# Patient Record
Sex: Female | Born: 1955 | Race: Black or African American | Hispanic: No | Smoking: Never smoker
Health system: Southern US, Community
[De-identification: ages and names within clinical notes are randomized; demographics above are authoritative.]

## PROBLEM LIST (undated history)

## (undated) DIAGNOSIS — D649 Anemia, unspecified: Secondary | ICD-10-CM

## (undated) DIAGNOSIS — J302 Other seasonal allergic rhinitis: Secondary | ICD-10-CM

## (undated) DIAGNOSIS — M1990 Unspecified osteoarthritis, unspecified site: Secondary | ICD-10-CM

## (undated) HISTORY — DX: Unspecified osteoarthritis, unspecified site: M19.90

## (undated) HISTORY — DX: Other seasonal allergic rhinitis: J30.2

## (undated) HISTORY — DX: Anemia, unspecified: D64.9

---
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
## 1988-02-13 HISTORY — PX: PARTIAL HYSTERECTOMY: SHX80

## 1999-04-11 ENCOUNTER — Other Ambulatory Visit: Admission: RE | Admit: 1999-04-11 | Discharge: 1999-04-11 | Payer: Self-pay | Admitting: Obstetrics and Gynecology

## 2000-07-18 ENCOUNTER — Other Ambulatory Visit: Admission: RE | Admit: 2000-07-18 | Discharge: 2000-07-18 | Payer: Self-pay | Admitting: Obstetrics and Gynecology

## 2002-07-02 ENCOUNTER — Other Ambulatory Visit: Admission: RE | Admit: 2002-07-02 | Discharge: 2002-07-02 | Payer: Self-pay | Admitting: Obstetrics and Gynecology

## 2017-03-20 ENCOUNTER — Other Ambulatory Visit: Payer: Self-pay

## 2017-03-20 ENCOUNTER — Encounter (HOSPITAL_COMMUNITY): Payer: Self-pay | Admitting: Emergency Medicine

## 2017-03-20 ENCOUNTER — Ambulatory Visit (INDEPENDENT_AMBULATORY_CARE_PROVIDER_SITE_OTHER): Payer: Commercial Managed Care - PPO

## 2017-03-20 ENCOUNTER — Ambulatory Visit (HOSPITAL_COMMUNITY)
Admission: EM | Admit: 2017-03-20 | Discharge: 2017-03-20 | Disposition: A | Discharge status: Home / Self Care | Payer: Commercial Managed Care - PPO | Attending: Family Medicine | Admitting: Family Medicine

## 2017-03-20 DIAGNOSIS — R0981 Nasal congestion: Secondary | ICD-10-CM | POA: Diagnosis not present

## 2017-03-20 DIAGNOSIS — M791 Myalgia, unspecified site: Secondary | ICD-10-CM

## 2017-03-20 DIAGNOSIS — R05 Cough: Secondary | ICD-10-CM

## 2017-03-20 DIAGNOSIS — J3489 Other specified disorders of nose and nasal sinuses: Secondary | ICD-10-CM

## 2017-03-20 DIAGNOSIS — R509 Fever, unspecified: Secondary | ICD-10-CM | POA: Diagnosis not present

## 2017-03-20 DIAGNOSIS — R69 Illness, unspecified: Principal | ICD-10-CM

## 2017-03-20 DIAGNOSIS — R0602 Shortness of breath: Secondary | ICD-10-CM

## 2017-03-20 DIAGNOSIS — R0609 Other forms of dyspnea: Secondary | ICD-10-CM

## 2017-03-20 DIAGNOSIS — J111 Influenza due to unidentified influenza virus with other respiratory manifestations: Secondary | ICD-10-CM

## 2017-03-20 MED ORDER — FLUTICASONE PROPIONATE 50 MCG/ACT NA SUSP: 2.0000 | Freq: Every day | NASAL | 0 refills | Status: DC

## 2017-03-20 MED ORDER — IPRATROPIUM BROMIDE 0.06 % NA SOLN: 2.0000 | Freq: Four times a day (QID) | NASAL | 0 refills | Status: DC

## 2017-03-20 MED ORDER — BENZONATATE 100 MG PO CAPS: 100.0000 mg | ORAL_CAPSULE | Freq: Three times a day (TID) | ORAL | 0 refills | Status: DC

## 2017-03-20 NOTE — Discharge Instructions (Signed)
X-ray negative for pneumonia. Tessalon for cough. Start flonase, atrovent nasal spray for nasal congestion/drainage. You can also take over the counter zyrtec-D to help with nasal congestion/drainage. You can use over the counter nasal saline rinse such as neti pot for nasal congestion. Keep hydrated, your urine should be clear to pale yellow in color. Tylenol/motrin for fever and pain. Monitor for any worsening of symptoms, chest pain, shortness of breath, wheezing, swelling of the throat, follow up for reevaluation.   For sore throat try using a honey-based tea. Use 3 teaspoons of honey with juice squeezed from half lemon. Place shaved pieces of ginger into 1/2-1 cup of water and warm over stove top. Then mix the ingredients and repeat every 4 hours as needed.

## 2017-03-20 NOTE — ED Triage Notes (Signed)
Pt. Stated, Donnald Garre had flu symptoms since Sat. with body aches and fever .

## 2017-03-20 NOTE — ED Provider Notes (Signed)
[facility]    CSN: [id number] Arrival date & time: 03/20/17  1618     History   Chief Complaint Chief Complaint  Patient presents with  . Influenza  . Fever  . Generalized Body Aches    HPI Brittany Chandler is a 62 y.o. female.   62 year old female comes in for 4-5-day history of URI symptoms. Productive cough, rhinorrhea, nasal congestion, body aches.  T-max 102, Tylenol, last dose this morning.  Denies sore throat. States slight dyspnea on exertion. otc mucniex with some relief. Never smoker. Denies flu shot.  No sick contact.      History reviewed. No pertinent past medical history.  There are no active problems to display for this patient.   History reviewed. No pertinent surgical history.  OB History    No data available       Home Medications    Prior to Admission medications   Medication Sig Start Date End Date Taking? Authorizing Provider  benzonatate (TESSALON) 100 MG capsule Take 1 capsule (100 mg total) by mouth every 8 (eight) hours. 03/20/17   Tasia Catchings, Amy V, PA-C  fluticasone (FLONASE) 50 MCG/ACT nasal spray Place 2 sprays into both nostrils daily. 03/20/17   Tasia Catchings, Amy V, PA-C  ipratropium (ATROVENT) 0.06 % nasal spray Place 2 sprays into both nostrils 4 (four) times daily. 03/20/17   Ok Edwards, PA-C    Family History No family history on file.  Social History Social History   Tobacco Use  . Smoking status: Never Smoker  . Smokeless tobacco: Never Used  Substance Use Topics  . Alcohol use: No    Frequency: Never  . Drug use: No     Allergies   Patient has no known allergies.   Review of Systems Review of Systems  Reason unable to perform ROS: See HPI as above.     Physical Exam Triage Vital Signs ED Triage Vitals  Enc Vitals Group     BP 03/20/17 1755 (!) 142/91     Pulse Rate 03/20/17 1754 99     Resp 03/20/17 1754 17     Temp 03/20/17 1754 98.6 F (37 C)     Temp Source 03/20/17 1754 Oral     SpO2 03/20/17 1754  98 %     Weight 03/20/17 1754 150 lb (68 kg)     Height 03/20/17 1754 5\' 6"  (1.676 m)     Head Circumference --      Peak Flow --      Pain Score 03/20/17 1755 4     Pain Loc --      Pain Edu? --      Excl. in [place]? --    No data found.  Updated Vital Signs BP (!) 142/91   Pulse 99   Temp 98.6 F (37 C) (Oral)   Resp 17   Ht 5\' 6"  (1.676 m)   Wt 150 lb (68 kg)   SpO2 98%   BMI 24.21 kg/m   Physical Exam  Constitutional: She is oriented to person, place, and time. She appears well-developed and well-nourished. No distress.  HENT:  Head: Normocephalic and atraumatic.  Right Ear: Tympanic membrane, external ear and ear canal normal. Tympanic membrane is not erythematous and not bulging.  Left Ear: Tympanic membrane, external ear and ear canal normal. Tympanic membrane is not erythematous and not bulging.  Nose: Mucosal edema and rhinorrhea present. Right sinus exhibits maxillary sinus tenderness and frontal sinus tenderness. Left sinus exhibits maxillary  sinus tenderness and frontal sinus tenderness.  Mouth/Throat: Uvula is midline, oropharynx is clear and moist and mucous membranes are normal.  Eyes: Conjunctivae are normal. Pupils are equal, round, and reactive to light.  Neck: Normal range of motion. Neck supple.  Cardiovascular: Normal rate, regular rhythm and normal heart sounds. Exam reveals no gallop and no friction rub.  No murmur heard. Pulmonary/Chest: Effort normal.  Patient coughing throughout exam.  Coarse breath sounds throughout, with wheezing to the left lower field.  Lymphadenopathy:    She has no cervical adenopathy.  Neurological: She is alert and oriented to person, place, and time.  Skin: Skin is warm and dry.  Psychiatric: She has a normal mood and affect. Her behavior is normal. Judgment normal.     UC Treatments / Results  Labs (all labs ordered are listed, but only abnormal results are displayed) Labs Reviewed - No data to display  EKG  EKG  Interpretation None       Radiology Dg Chest 2 View  Result Date: 03/20/2017 CLINICAL DATA:  Fever. Productive cough. Shortness of breath. Symptoms 5 days duration. EXAM: CHEST  2 VIEW COMPARISON:  None. FINDINGS: Heart size is normal. Mild atherosclerotic change of the aorta. The pulmonary vascularity is normal. Lungs are clear. No effusions. No significant bone finding. IMPRESSION: No active cardiopulmonary disease. Electronically Signed   By: Nelson Chimes M.D.   On: 03/20/2017 18:30    Procedures Procedures (including critical care time)  Medications Ordered in UC Medications - No data to display   Initial Impression / Assessment and Plan / UC Course  I have reviewed the triage vital signs and the nursing notes.  Pertinent labs & imaging results that were available during my care of the patient were reviewed by me and considered in my medical decision making (see chart for details).    X-ray negative for pneumonia.  Will start symptomatic treatment.  Push fluids.  Return precautions given.  Patient expresses understanding and agrees to plan.  Final Clinical Impressions(s) / UC Diagnoses   Final diagnoses:  Influenza-like illness    ED Discharge Orders        Ordered    benzonatate (TESSALON) 100 MG capsule  Every 8 hours     03/20/17 1838    fluticasone (FLONASE) 50 MCG/ACT nasal spray  Daily     03/20/17 1838    ipratropium (ATROVENT) 0.06 % nasal spray  4 times daily     03/20/17 Wilber Oliphant, PA-C 03/20/17 1955

## 2019-12-26 ENCOUNTER — Ambulatory Visit: Payer: Self-pay | Attending: Internal Medicine

## 2019-12-26 DIAGNOSIS — Z23 Encounter for immunization: Secondary | ICD-10-CM

## 2019-12-26 NOTE — Progress Notes (Signed)
   Covid-19 Vaccination Clinic  Name:  Brittany Chandler    MRN: [id number] DOB: [birth year]  12/26/2019  Brittany Chandler was observed post Covid-19 immunization for 15 minutes without incident. She was provided with Vaccine Information Sheet and instruction to access the V-Safe system.   Brittany Chandler was instructed to call 911 with any severe reactions post vaccine: Marland Kitchen Difficulty breathing  . Swelling of face and throat  . A fast heartbeat  . A bad rash all over body  . Dizziness and weakness   Immunizations Administered    Name Date Dose VIS Date Route   Pfizer COVID-19 Vaccine 12/26/2019  4:21 PM 0.3 mL 12/02/2019 Intramuscular   Manufacturer: [place]   Lot: [id number]   [place]: [id number]

## 2020-02-22 ENCOUNTER — Other Ambulatory Visit: Payer: Self-pay

## 2020-02-22 DIAGNOSIS — Z20822 Contact with and (suspected) exposure to covid-19: Secondary | ICD-10-CM

## 2020-02-25 LAB — NOVEL CORONAVIRUS, NAA: SARS-CoV-2, NAA: NOT DETECTED

## 2020-02-25 LAB — SARS-COV-2, NAA 2 DAY TAT

## 2020-03-08 ENCOUNTER — Other Ambulatory Visit: Payer: Self-pay

## 2020-03-08 DIAGNOSIS — Z20822 Contact with and (suspected) exposure to covid-19: Secondary | ICD-10-CM

## 2020-03-10 LAB — SARS-COV-2, NAA 2 DAY TAT

## 2020-03-10 LAB — NOVEL CORONAVIRUS, NAA: SARS-CoV-2, NAA: NOT DETECTED

## 2021-05-05 ENCOUNTER — Encounter: Payer: Self-pay | Admitting: Nurse Practitioner

## 2021-05-05 ENCOUNTER — Ambulatory Visit (INDEPENDENT_AMBULATORY_CARE_PROVIDER_SITE_OTHER): Payer: HMO | Admitting: Nurse Practitioner

## 2021-05-05 ENCOUNTER — Other Ambulatory Visit: Payer: Self-pay

## 2021-05-05 VITALS — BP 118/80 | HR 77 | Temp 98.6°F | Ht 66.0 in | Wt 139.0 lb

## 2021-05-05 DIAGNOSIS — R634 Abnormal weight loss: Secondary | ICD-10-CM | POA: Diagnosis not present

## 2021-05-05 DIAGNOSIS — R222 Localized swelling, mass and lump, trunk: Secondary | ICD-10-CM

## 2021-05-05 DIAGNOSIS — Z1211 Encounter for screening for malignant neoplasm of colon: Secondary | ICD-10-CM | POA: Insufficient documentation

## 2021-05-05 DIAGNOSIS — Z1231 Encounter for screening mammogram for malignant neoplasm of breast: Secondary | ICD-10-CM | POA: Insufficient documentation

## 2021-05-05 DIAGNOSIS — Z124 Encounter for screening for malignant neoplasm of cervix: Secondary | ICD-10-CM | POA: Insufficient documentation

## 2021-05-05 DIAGNOSIS — L309 Dermatitis, unspecified: Secondary | ICD-10-CM

## 2021-05-05 LAB — IRON: Iron: 73 ug/dL (ref 42–145)

## 2021-05-05 LAB — COMPREHENSIVE METABOLIC PANEL
ALT: 23 U/L (ref 0–35)
AST: 19 U/L (ref 0–37)
Albumin: 4.6 g/dL (ref 3.5–5.2)
Alkaline Phosphatase: 112 U/L (ref 39–117)
BUN: 17 mg/dL (ref 6–23)
CO2: 30 mEq/L (ref 19–32)
Calcium: 9.4 mg/dL (ref 8.4–10.5)
Chloride: 105 mEq/L (ref 96–112)
Creatinine, Ser: 0.86 mg/dL (ref 0.40–1.20)
GFR: 70.81 mL/min (ref >60.00)
Glucose, Bld: 94 mg/dL (ref 70–99)
Potassium: 4.3 mEq/L (ref 3.5–5.1)
Sodium: 141 mEq/L (ref 135–145)
Total Bilirubin: 0.5 mg/dL (ref 0.2–1.2)
Total Protein: 7.1 g/dL (ref 6.0–8.3)

## 2021-05-05 LAB — CBC WITH DIFFERENTIAL/PLATELET
Basophils Absolute: 0 10*3/uL (ref 0.0–0.1)
Basophils Relative: 0.3 % (ref 0.0–3.0)
Eosinophils Absolute: 0.3 10*3/uL (ref 0.0–0.7)
Eosinophils Relative: 4 % (ref 0.0–5.0)
HCT: 35.6 % — ABNORMAL LOW (ref 36.0–46.0)
Hemoglobin: 11.8 g/dL — ABNORMAL LOW (ref 12.0–15.0)
Lymphocytes Relative: 23.3 % (ref 12.0–46.0)
Lymphs Abs: 1.7 10*3/uL (ref 0.7–4.0)
MCHC: 33.2 g/dL (ref 30.0–36.0)
MCV: 96.7 fl (ref 78.0–100.0)
Monocytes Absolute: 0.5 10*3/uL (ref 0.1–1.0)
Monocytes Relative: 6.3 % (ref 3.0–12.0)
Neutro Abs: 4.9 10*3/uL (ref 1.4–7.7)
Neutrophils Relative %: 66.1 % (ref 43.0–77.0)
Platelets: 304 10*3/uL (ref 150.0–400.0)
RBC: 3.68 Mil/uL — ABNORMAL LOW (ref 3.87–5.11)
RDW: 14.8 % (ref 11.5–15.5)
WBC: 7.4 10*3/uL (ref 4.0–10.5)

## 2021-05-05 LAB — LIPID PANEL
Cholesterol: 162 mg/dL (ref 0–200)
HDL: 63 mg/dL (ref >39.00)
LDL Cholesterol: 89 mg/dL (ref 0–99)
NonHDL: 99.49
Total CHOL/HDL Ratio: 3
Triglycerides: 52 mg/dL (ref 0.0–149.0)
VLDL: 10.4 mg/dL (ref 0.0–40.0)

## 2021-05-05 LAB — SEDIMENTATION RATE: Sed Rate: 22 mm/hr (ref 0–30)

## 2021-05-05 LAB — HEMOGLOBIN A1C: Hgb A1c MFr Bld: 6.3 % (ref 4.6–6.5)

## 2021-05-05 MED ORDER — TRIAMCINOLONE ACETONIDE 0.1 % EX CREA: 1.0000 "application " | TOPICAL_CREAM | Freq: Two times a day (BID) | CUTANEOUS | 0 refills | Status: AC

## 2021-05-05 NOTE — Assessment & Plan Note (Signed)
Referral to gastroenterology made today. ?

## 2021-05-05 NOTE — Assessment & Plan Note (Signed)
Referral to OBGYN made today.  

## 2021-05-05 NOTE — Assessment & Plan Note (Signed)
Area of concern behind her right ear does appear consistent with possible eczema.  We will trial triamcinolone application twice a day to see if this reduces itching.  Patient was told to use it for up to 2 weeks, and notify me if symptoms persist past this point.  She reports her understanding. ?

## 2021-05-05 NOTE — Assessment & Plan Note (Signed)
Probably multifactorial in etiology.  We will start by getting blood work for further evaluation as well as make sure she is up-to-date with routine cancer screenings.  Further recommendations will be made based upon these results. ?

## 2021-05-05 NOTE — Assessment & Plan Note (Signed)
Ultrasound ordered for further evaluation of mass on her back.  Clinically does appear to be lipoma, however will get further evaluation via ultrasound. ?

## 2021-05-05 NOTE — Progress Notes (Signed)
? ? ? ?Subjective:  ?Patient ID: Brittany Chandler, female    DOB: 11/25/1955  Age: 66 y.o. MRN: [id number] ? ?CC:  ?Chief Complaint  ?Patient presents with  ? New Patient (Initial Visit)  ?  ? ? ?HPI  ?This patient arrives today for the above. ? ?She is here to establish care with PCP as she has more time available to focus on her health now.  Recently overall she feels well but did have a couple of concerns as discussed below. ? ?Rash: She has a postauricular rash that is itchy and flaky.  This is been present for couple of years.  She has been applying A&D ointment to the area, but would like for it to be evaluated.  She is concerned it may be eczema. ? ?Mass on back: She has a mass on her back that is soft and movable.  Its been present since at least 2014.  She has a family history of cancer including kidney cancer and multiple myeloma, and wanted this mass to be evaluated. ? ?Weight loss: She also reports over the last 8 months she has been experiencing unintentional weight loss.  She tells me she has not been eating as much and does notice some early satiety.  He is also reduced her intake of carbs.  Additionally she has started a new romantic relationship and has been more aware of her weight and eating habits for this reason.  With that said she reports going down 3 dress sizes without significant intention. ? ?No past medical history on file. ? ? ? ?No family history on file. ? ?Social History  ? ?Social History Narrative  ? Not on file  ? ?Social History  ? ?Tobacco Use  ? Smoking status: Never  ? Smokeless tobacco: Never  ?Substance Use Topics  ? Alcohol use: No  ? ? ? ?Current Meds  ?Medication Sig  ? triamcinolone cream (KENALOG) 0.1 % Apply 1 application. topically 2 (two) times daily.  ? ? ?ROS:  ?Review of Systems  ?Constitutional:  Positive for weight loss. Negative for fever and malaise/fatigue.  ?Eyes:  Negative for blurred vision.  ?Respiratory:  Negative for shortness of breath.    ?Cardiovascular:  Negative for chest pain.  ?Gastrointestinal:  Negative for abdominal pain and blood in stool.  ?     (+) early satiety, (+) reduced appetite  ?Neurological:  Negative for dizziness, loss of consciousness and headaches.  ?Psychiatric/Behavioral:  Negative for depression and suicidal ideas.   ? ? ?Objective:  ? ?Today's Vitals: BP 118/80   Pulse 77   Temp 98.6 ?F (37 ?C) (Oral)   Ht 5' 6"  (1.676 m)   Wt 139 lb (63 kg)   SpO2 98%   BMI 22.44 kg/m?  ? ?  05/05/2021  ?  1:10 PM 03/20/2017  ?  5:55 PM 03/20/2017  ?  5:54 PM  ?Vitals with BMI  ?Height 5' 6"   5' 6"   ?Weight 139 lbs  150 lbs  ?BMI 22.45  24.22  ?Systolic 326 712   ?Diastolic 80 91   ?Pulse 77  99  ?  ? ?Physical Exam ?Vitals reviewed.  ?Constitutional:   ?   General: She is not in acute distress. ?   Appearance: Normal appearance.  ?HENT:  ?   Head: Normocephalic and atraumatic.  ? ?   Comments: Red, dry, flaky  ?Neck:  ?   Vascular: No carotid bruit.  ?Cardiovascular:  ?   Rate and Rhythm: Normal rate  and regular rhythm.  ?   Pulses: Normal pulses.  ?   Heart sounds: Normal heart sounds.  ?Pulmonary:  ?   Effort: Pulmonary effort is normal.  ?   Breath sounds: Normal breath sounds.  ?Skin: ?   General: Skin is warm and dry.  ? ?    ?Neurological:  ?   General: No focal deficit present.  ?   Mental Status: She is alert and oriented to person, place, and time.  ?Psychiatric:     ?   Mood and Affect: Mood normal.     ?   Behavior: Behavior normal.     ?   Judgment: Judgment normal.  ? ? ? ? ? ? ? ?Assessment and Plan  ? ?1. Eczema, unspecified type   ?2. Mass on back   ?3. Unintentional weight loss   ?4. Colon cancer screening   ?5. Cervical cancer screening   ?6. Encounter for screening mammogram for malignant neoplasm of breast   ? ? ? ?Plan: ?See plan via problem list below. ? ? ? ?Tests ordered ?Orders Placed This Encounter  ?Procedures  ? [place] CHEST SOFT TISSUE  ? MM DIGITAL SCREENING BILATERAL  ? TSH  ? Hemoglobin A1c  ? Lipid panel  ?  Comprehensive metabolic panel  ? CBC with Differential/Platelet  ? T3, free  ? T4, free  ? B12  ? Iron  ? Ferritin  ? HIV Antibody (routine testing w rflx)  ? RPR  ? Sedimentation rate  ? Ambulatory referral to Gastroenterology  ? Ambulatory referral to Obstetrics / Gynecology  ? ? ? ? ?Meds ordered this encounter  ?Medications  ? triamcinolone cream (KENALOG) 0.1 %  ?  Sig: Apply 1 application. topically 2 (two) times daily.  ?  Dispense:  30 g  ?  Refill:  0  ?  Order Specific Question:   Supervising Provider  ?  Answer:   Binnie Rail [[id number]]  ? ? ?Patient to follow-up in 1 month or sooner as needed. ? ?Ailene Ards, NP ? ?

## 2021-05-05 NOTE — Assessment & Plan Note (Signed)
Bilateral screening mammogram ordered today. ?

## 2021-05-08 LAB — RPR: RPR Ser Ql: NONREACTIVE

## 2021-05-08 LAB — HIV ANTIBODY (ROUTINE TESTING W REFLEX): HIV 1&2 Ab, 4th Generation: NONREACTIVE

## 2021-05-09 ENCOUNTER — Ambulatory Visit
Admission: RE | Admit: 2021-05-09 | Discharge: 2021-05-09 | Disposition: A | Discharge status: Home / Self Care | Payer: HMO | Source: Ambulatory Visit | Attending: Nurse Practitioner | Admitting: Nurse Practitioner

## 2021-05-09 DIAGNOSIS — Z1231 Encounter for screening mammogram for malignant neoplasm of breast: Secondary | ICD-10-CM

## 2021-05-09 DIAGNOSIS — R222 Localized swelling, mass and lump, trunk: Secondary | ICD-10-CM

## 2021-05-09 DIAGNOSIS — R634 Abnormal weight loss: Secondary | ICD-10-CM

## 2021-05-09 DIAGNOSIS — L309 Dermatitis, unspecified: Secondary | ICD-10-CM

## 2021-05-09 LAB — T4, FREE: Free T4: 0.81 ng/dL (ref 0.60–1.60)

## 2021-05-09 LAB — VITAMIN B12: Vitamin B-12: 589 pg/mL (ref 211–911)

## 2021-05-09 LAB — FERRITIN: Ferritin: 64.9 ng/mL (ref 10.0–291.0)

## 2021-05-09 LAB — T3, FREE: T3, Free: 2.8 pg/mL (ref 2.3–4.2)

## 2021-05-09 LAB — TSH: TSH: 1.89 u[IU]/mL (ref 0.35–5.50)

## 2021-05-10 ENCOUNTER — Encounter: Payer: Self-pay | Admitting: Internal Medicine

## 2021-05-11 ENCOUNTER — Encounter: Payer: Self-pay | Admitting: Nurse Practitioner

## 2021-05-15 ENCOUNTER — Ambulatory Visit (AMBULATORY_SURGERY_CENTER): Payer: HMO

## 2021-05-15 VITALS — Ht 66.0 in | Wt 139.0 lb

## 2021-05-15 DIAGNOSIS — Z1211 Encounter for screening for malignant neoplasm of colon: Secondary | ICD-10-CM

## 2021-05-15 NOTE — Progress Notes (Signed)
No egg or soy allergy known to patient  ?No issues known to pt with past sedation with any surgeries or procedures--patient reports she is "a lightweight with drugs-medications"; ?Patient denies ever being told they had issues or difficulty with intubation  ?No FH of Malignant Hyperthermia ?Pt is not on diet pills ?Pt is not on home 02  ?Pt is not on blood thinners  ?Pt denies issues with constipation  ?No A fib or A flutter ?NO PA's for preps discussed with pt in PV today  ?Discussed with pt there will be an out-of-pocket cost for prep and that varies from $0 to 70 + dollars - pt verbalized understanding  ?Due to the COVID-19 pandemic we are asking patients to follow certain guidelines in PV and the [place]   ?Pt aware of COVID protocols and [facility] guidelines  ?PV completed over the phone. Pt verified name, DOB, address and insurance during PV today.  ?Pt mailed instruction packet with copy of consent form to read and not return, and instructions.  ?Pt encouraged to call with questions or issues.  ?If pt has My chart, procedure instructions sent via My Chart  ? ?

## 2021-06-09 ENCOUNTER — Ambulatory Visit (INDEPENDENT_AMBULATORY_CARE_PROVIDER_SITE_OTHER): Payer: HMO | Admitting: Nurse Practitioner

## 2021-06-09 VITALS — BP 126/72 | HR 83 | Temp 98.5°F | Ht 66.0 in | Wt 148.0 lb

## 2021-06-09 DIAGNOSIS — R634 Abnormal weight loss: Secondary | ICD-10-CM

## 2021-06-09 DIAGNOSIS — D171 Benign lipomatous neoplasm of skin and subcutaneous tissue of trunk: Secondary | ICD-10-CM

## 2021-06-09 DIAGNOSIS — R7303 Prediabetes: Secondary | ICD-10-CM | POA: Diagnosis not present

## 2021-06-09 NOTE — Assessment & Plan Note (Signed)
Ultrasound provides further evidence that this is most likely lipoma.  Patient not interested in having this surgically removed at this time, but may consider this if it bothers her in the future.  She is encouraged let me know if mass size increases. ?

## 2021-06-09 NOTE — Assessment & Plan Note (Signed)
Overall evaluation so far has been reassuring.  Additionally patient has gained approximately 9 pounds since last office visit.  She was encouraged to follow-up as scheduled for Pap smear and colonoscopy.  She reports that she is agreeable to this plan.  Further recommendations may be made based upon these screen results. ?

## 2021-06-09 NOTE — Patient Instructions (Signed)

## 2021-06-09 NOTE — Progress Notes (Signed)
? ? ? ?Subjective:  ?Patient ID: Brittany Chandler, female    DOB: 05-23-1955  Age: 66 y.o. MRN: [id number] ? ?CC:  ?Chief Complaint  ?Patient presents with  ? Follow-up  ? Prediabetes  ?  ? ? ?HPI  ?This patient arrives today for the above. ? ?Unintentional weight loss: Blood work collected at last office visit which showed mild normocytic anemia with a hemoglobin of 11.8.  Normal iron and ferritin levels.  CMP was normal, lipid panel normal, vitamin B12 normal, sed rate normal, A1c of 6.3, thyroid panel normal, STI screening normal.  Patient has also undergone screening mammogram since last office visit and this was negative for signs of malignancy.  She is scheduled for screening colonoscopy coming up next week.  She also reports she believes she has a Pap smear scheduled with OB in the near future.  Since our last office visit she has been a bit less stressed than when on vacation.  She has noticed she is gained weight since that appointment and is overall feeling better. ? ?Prediabetes: Labs showed A1c of 6.3.  Patient does report eating [place] fries fairly regularly, and is motivated to try lifestyle measures as opposed to medication for treatment of prediabetes. ? ?Lipoma: Mass on her back was evaluated using ultrasound.  Imaging further supports diagnosis of lipoma.  Patient denies pain, however it does rub against her bra strap and causes discomfort intermittently.  She would like to hold off on referral to general surgery today, but may consider this in the future. ? ?Past Medical History:  ?Diagnosis Date  ? Anemia   ? no meds at this time (05/15/2021)  ? Arthritis   ? OTC PRN meds  ? Seasonal allergies   ? ? ? ? ?Family History  ?Problem Relation Age of Onset  ? Multiple myeloma Father   ? Multiple myeloma Sister   ? Breast cancer Neg Hx   ? Colon cancer Neg Hx   ? Colon polyps Neg Hx   ? Esophageal cancer Neg Hx   ? Rectal cancer Neg Hx   ? Stomach cancer Neg Hx   ? ? ?Social History  ? ?Social  History Narrative  ? Not on file  ? ?Social History  ? ?Tobacco Use  ? Smoking status: Never  ? Smokeless tobacco: Never  ?Substance Use Topics  ? Alcohol use: Yes  ?  Comment: special occasions  ? ? ? ?Current Meds  ?Medication Sig  ? triamcinolone cream (KENALOG) 0.1 % Apply 1 application. topically 2 (two) times daily.  ? ? ?ROS:  ?Review of Systems  ?Respiratory:  Negative for shortness of breath.   ?Cardiovascular:  Negative for chest pain.  ?Gastrointestinal:  Negative for abdominal pain.  ? ? ?Objective:  ? ?Today's Vitals: BP 126/72 (BP Location: Right Arm, Patient Position: Sitting, Cuff Size: Large)   Pulse 83   Temp 98.5 ?F (36.9 ?C) (Oral)   Ht 5' 6"  (1.676 m)   Wt 148 lb (67.1 kg)   SpO2 95%   BMI 23.89 kg/m?  ? ?  06/09/2021  ?  1:23 PM 05/15/2021  ?  3:40 PM 05/05/2021  ?  1:10 PM  ?Vitals with BMI  ?Height 5' 6"  5' 6"  5' 6"   ?Weight 148 lbs 139 lbs 139 lbs  ?BMI 23.9 22.45 22.45  ?Systolic 628  638  ?Diastolic 72  80  ?Pulse 83  77  ?  ? ?Physical Exam ?Vitals reviewed.  ?Constitutional:   ?  General: She is not in acute distress. ?   Appearance: Normal appearance.  ?HENT:  ?   Head: Normocephalic and atraumatic.  ?Neck:  ?   Vascular: No carotid bruit.  ?Cardiovascular:  ?   Rate and Rhythm: Normal rate and regular rhythm.  ?   Pulses: Normal pulses.  ?   Heart sounds: Normal heart sounds.  ?Pulmonary:  ?   Effort: Pulmonary effort is normal.  ?   Breath sounds: Normal breath sounds.  ?Skin: ?   General: Skin is warm and dry.  ?Neurological:  ?   General: No focal deficit present.  ?   Mental Status: She is alert and oriented to person, place, and time.  ?Psychiatric:     ?   Mood and Affect: Mood normal.     ?   Behavior: Behavior normal.     ?   Judgment: Judgment normal.  ? ? ? ? ? ? ? ?Assessment and Plan  ? ?1. Unintentional weight loss   ?2. Lipoma of torso   ?3. Prediabetes   ? ? ? ?Plan: ?See plan via problem list below. ? ? ?Tests ordered ?No orders of the defined types were placed in  this encounter. ? ? ? ? ?No orders of the defined types were placed in this encounter. ? ? ?Patient to follow-up in 3 months, or sooner as needed. ? ?Ailene Ards, NP ? ?

## 2021-06-09 NOTE — Assessment & Plan Note (Signed)
Most recent A1c of 6.3.  We discussed lifestyle modification and she will try to make some dietary changes were she is able to.  Consider rechecking A1c at next office visit. ?

## 2021-06-12 ENCOUNTER — Ambulatory Visit (AMBULATORY_SURGERY_CENTER): Payer: HMO | Admitting: Internal Medicine

## 2021-06-12 ENCOUNTER — Encounter: Payer: Self-pay | Admitting: Internal Medicine

## 2021-06-12 VITALS — BP 128/84 | HR 58 | Temp 96.4°F | Resp 12 | Ht 66.0 in | Wt 139.0 lb

## 2021-06-12 DIAGNOSIS — D122 Benign neoplasm of ascending colon: Secondary | ICD-10-CM

## 2021-06-12 DIAGNOSIS — D120 Benign neoplasm of cecum: Secondary | ICD-10-CM

## 2021-06-12 DIAGNOSIS — Z1211 Encounter for screening for malignant neoplasm of colon: Secondary | ICD-10-CM | POA: Diagnosis not present

## 2021-06-12 DIAGNOSIS — D123 Benign neoplasm of transverse colon: Secondary | ICD-10-CM

## 2021-06-12 MED ORDER — SODIUM CHLORIDE 0.9 % IV SOLN: 500.0000 mL | Freq: Once | INTRAVENOUS | Status: DC

## 2021-06-12 NOTE — Progress Notes (Signed)
? ?GASTROENTEROLOGY PROCEDURE H&P NOTE  ? ?Primary Care Physician: ?Ailene Ards, NP ? ? ? ?Reason for Procedure:   Colon cancer screening ? ?Plan:    Colonoscopy ? ?Patient is appropriate for endoscopic procedure(s) in the ambulatory ([facility]) setting. ? ?The nature of the procedure, as well as the risks, benefits, and alternatives were carefully and thoroughly reviewed with the patient. Ample time for discussion and questions allowed. The patient understood, was satisfied, and agreed to proceed.  ? ? ? ?HPI: ?Brittany Chandler is a 66 y.o. female who presents for colonoscopy for colon cancer screening. Denies blood in stools, changes in bowel habits, weight loss. Denies fam hx of colon cancer. ? ?Past Medical History:  ?Diagnosis Date  ? Anemia   ? no meds at this time (05/15/2021)  ? Arthritis   ? OTC PRN meds  ? Seasonal allergies   ? ? ?Past Surgical History:  ?Procedure Laterality Date  ? PARTIAL HYSTERECTOMY  1990  ? THYROIDECTOMY  1976  ? ? ?Prior to Admission medications   ?Medication Sig Start Date End Date Taking? Authorizing Provider  ?triamcinolone cream (KENALOG) 0.1 % Apply 1 application. topically 2 (two) times daily. 05/05/21   Ailene Ards, NP  ? ? ?Current Outpatient Medications  ?Medication Sig Dispense Refill  ? triamcinolone cream (KENALOG) 0.1 % Apply 1 application. topically 2 (two) times daily. 30 g 0  ? ?Current Facility-Administered Medications  ?Medication Dose Route Frequency Provider Last Rate Last Admin  ? 0.9 %  sodium chloride infusion  500 mL Intravenous Once Sharyn Creamer, MD      ? ? ?Allergies as of 06/12/2021 - Review Complete 06/12/2021  ?Allergen Reaction Noted  ? Bee venom Hives 12/26/2019  ? Shellfish allergy Hives 12/26/2019  ? Ibuprofen Hives 05/15/2018  ? ? ?Family History  ?Problem Relation Age of Onset  ? Multiple myeloma Father   ? Multiple myeloma Sister   ? Breast cancer Neg Hx   ? Colon cancer Neg Hx   ? Colon polyps Neg Hx   ? Esophageal cancer Neg Hx   ?  Rectal cancer Neg Hx   ? Stomach cancer Neg Hx   ? ? ?Social History  ? ?Socioeconomic History  ? Marital status: Widowed  ?  Spouse name: Not on file  ? Number of children: Not on file  ? Years of education: Not on file  ? Highest education level: Not on file  ?Occupational History  ? Not on file  ?Tobacco Use  ? Smoking status: Never  ? Smokeless tobacco: Never  ?Vaping Use  ? Vaping Use: Never used  ?Substance and Sexual Activity  ? Alcohol use: Yes  ?  Comment: special occasions  ? Drug use: No  ? Sexual activity: Not on file  ?Other Topics Concern  ? Not on file  ?Social History Narrative  ? Not on file  ? ?Social Determinants of Health  ? ?Financial Resource Strain: Not on file  ?Food Insecurity: Not on file  ?Transportation Needs: Not on file  ?Physical Activity: Not on file  ?Stress: Not on file  ?Social Connections: Not on file  ?Intimate Partner Violence: Not on file  ? ? ?Physical Exam: ?Vital signs in last 24 hours: ?BP 100/61   Pulse 61   Temp (!) 96.4 ?F (35.8 ?C)   Ht 5' 6"  (1.676 m)   Wt 139 lb (63 kg)   SpO2 99%   BMI 22.44 kg/m?  ?GEN: NAD ?EYE: Sclerae anicteric ?  ENT: MMM ?CV: Non-tachycardic ?Pulm: No increased work of breathing ?GI: Soft, NT/ND ?NEURO:  Alert & Oriented ? ? ?[name], MD ?[facility] ? ?06/12/2021 11:12 AM ? ?

## 2021-06-12 NOTE — Progress Notes (Signed)
Report given to PACU, vss 

## 2021-06-12 NOTE — Progress Notes (Signed)
Pt's states no medical or surgical changes since previsit or office visit. 

## 2021-06-12 NOTE — Op Note (Signed)
[place] ?Patient Name: Brittany Chandler ?Procedure Date: 06/12/2021 12:08 PM ?MRN: [id number] ?Endoscopist: Brittany Chandler "Brittany Chandler ,  ?Age: 66 ?Referring MD:  ?Date of Birth: 1955-06-13 ?Gender: Female ?Account #: [id number] ?Procedure:                Colonoscopy ?Indications:              Screening for colorectal malignant neoplasm, This  ?                          is the patient's first colonoscopy ?Medicines:                Monitored Anesthesia Care ?Procedure:                Pre-Anesthesia Assessment: ?                          - Prior to the procedure, a History and Physical  ?                          was performed, and patient medications and  ?                          allergies were reviewed. The patient's tolerance of  ?                          previous anesthesia was also reviewed. The risks  ?                          and benefits of the procedure and the sedation  ?                          options and risks were discussed with the patient.  ?                          All questions were answered, and informed consent  ?                          was obtained. Prior Anticoagulants: The patient has  ?                          taken no previous anticoagulant or antiplatelet  ?                          agents. ASA Grade Assessment: II - A patient with  ?                          mild systemic disease. After reviewing the risks  ?                          and benefits, the patient was deemed in  ?                          satisfactory condition to undergo the procedure. ?  After obtaining informed consent, the colonoscope  ?                          was passed under direct vision. Throughout the  ?                          procedure, the patient's blood pressure, pulse, and  ?                          oxygen saturations were monitored continuously. The  ?                          Olympus PCF-H190DL ([id number]) Colonoscope was  ?                          introduced  through the anus and advanced to the the  ?                          terminal ileum. The colonoscopy was performed  ?                          without difficulty. The patient tolerated the  ?                          procedure well. The quality of the bowel  ?                          preparation was good. The terminal ileum, ileocecal  ?                          valve, appendiceal orifice, and rectum were  ?                          photographed. ?Scope In: 25:85:27 PM ?Scope Out: 12:34:47 PM ?Scope Withdrawal Time: 0 hours 16 minutes 1 second  ?Total Procedure Duration: 0 hours 20 minutes 29 seconds  ?Findings:                 The terminal ileum appeared normal. ?                          Three sessile polyps were found in the transverse  ?                          colon, ascending colon and cecum. The polyps were 3  ?                          to 10 mm in size. These polyps were removed with a  ?                          cold snare. Resection and retrieval were complete. ?                          Non-bleeding internal hemorrhoids were found during  ?  retroflexion. ?Complications:            No immediate complications. ?Estimated Blood Loss:     Estimated blood loss was minimal. ?Impression:               - The examined portion of the ileum was normal. ?                          - Three 3 to 10 mm polyps in the transverse colon,  ?                          in the ascending colon and in the cecum, removed  ?                          with a cold snare. Resected and retrieved. ?                          - Non-bleeding internal hemorrhoids. ?Recommendation:           - Discharge patient to home (with escort). ?                          - Await pathology results. ?                          - The findings and recommendations were discussed  ?                          with the patient. ?Brittany Chandler,  ?06/12/2021 12:37:49 PM ?

## 2021-06-12 NOTE — Progress Notes (Signed)
Called to room to assist during endoscopic procedure.  Patient ID and intended procedure confirmed with present staff. Received instructions for my participation in the procedure from the performing physician.  

## 2021-06-12 NOTE — Patient Instructions (Signed)

## 2021-06-14 ENCOUNTER — Encounter: Payer: Self-pay | Admitting: Internal Medicine

## 2021-06-14 ENCOUNTER — Telehealth: Payer: Self-pay

## 2021-06-14 NOTE — Telephone Encounter (Signed)
?  Follow up Call- ? ? ?  06/12/2021  ? 10:27 AM  ?Call back number  ?Post procedure Call Back phone  # [phone]  ?Permission to leave phone message Yes  ?  ? ?Patient questions: ? ?Do you have a fever, pain , or abdominal swelling? No. ?Pain Score  0 * ? ?Have you tolerated food without any problems? Yes.   ? ?Have you been able to return to your normal activities? Yes.   ? ?Do you have any questions about your discharge instructions: ?Diet   No. ?Medications  No. ?Follow up visit  No. ? ?Do you have questions or concerns about your Care? No. ? ?Actions: ?* If pain score is 4 or above: ?No action needed, pain <4. ? ? ?

## 2021-06-14 NOTE — Telephone Encounter (Signed)
?  Follow up Call- ? ? ?  06/12/2021  ? 10:27 AM  ?Call back number  ?Post procedure Call Back phone  # [phone]  ?Permission to leave phone message Yes  ?  ? ?Post op call attempted, no answer, left WM.  ? ?

## 2021-09-08 ENCOUNTER — Ambulatory Visit: Payer: HMO | Admitting: Nurse Practitioner

## 2021-09-22 ENCOUNTER — Ambulatory Visit: Payer: HMO | Admitting: Nurse Practitioner

## 2022-05-10 ENCOUNTER — Other Ambulatory Visit: Payer: Self-pay | Admitting: Nurse Practitioner

## 2022-05-10 DIAGNOSIS — Z1239 Encounter for other screening for malignant neoplasm of breast: Secondary | ICD-10-CM

## 2022-05-10 NOTE — Progress Notes (Signed)
Please notify patient that I have ordered screening mammogram as she is due for this. Someone from scheduling should call her to schedule an appointment.

## 2022-05-21 NOTE — Progress Notes (Signed)
Called pt and left voice mail to call back and confirm about mammogram appointment

## 2022-05-23 NOTE — Progress Notes (Signed)
Called pt and left voice mail to call back to confirm mammogram exam

## 2022-09-03 ENCOUNTER — Encounter: Payer: Self-pay | Admitting: *Deleted

## 2022-09-03 ENCOUNTER — Telehealth: Payer: Self-pay | Admitting: *Deleted

## 2022-09-03 NOTE — Telephone Encounter (Signed)
I attempted to contact patient by telephone but was unsuccessful. According to the patient's chart they are due for follow up with  [facility]. I have left a HIPAA compliant message advising the patient to contact [facility] at [phone]. I will continue to follow up with the patient to make sure this appointment is scheduled.

## 2022-09-06 NOTE — Telephone Encounter (Signed)
I attempted to contact patient by telephone but was unsuccessful. According to the patient's chart they are due for follow up with  [facility]. I have left a HIPAA compliant message advising the patient to contact [facility] at [phone]. I will continue to follow up with the patient to make sure this appointment is scheduled.

## 2022-12-02 IMAGING — MG MM DIGITAL SCREENING BILAT W/ TOMO AND CAD
6 of 10 series · 6 of 30 positions shown · non-contrast
Comparison: None.

CLINICAL DATA: Screening.

EXAM:
DIGITAL SCREENING BILATERAL MAMMOGRAM WITH TOMOSYNTHESIS AND CAD
TECHNIQUE: Bilateral screening digital craniocaudal and mediolateral oblique
mammograms were obtained. Bilateral screening digital breast
tomosynthesis was performed. The images were evaluated with
computer-aided detection.

[L MLO synth-2D (1 of 2)]
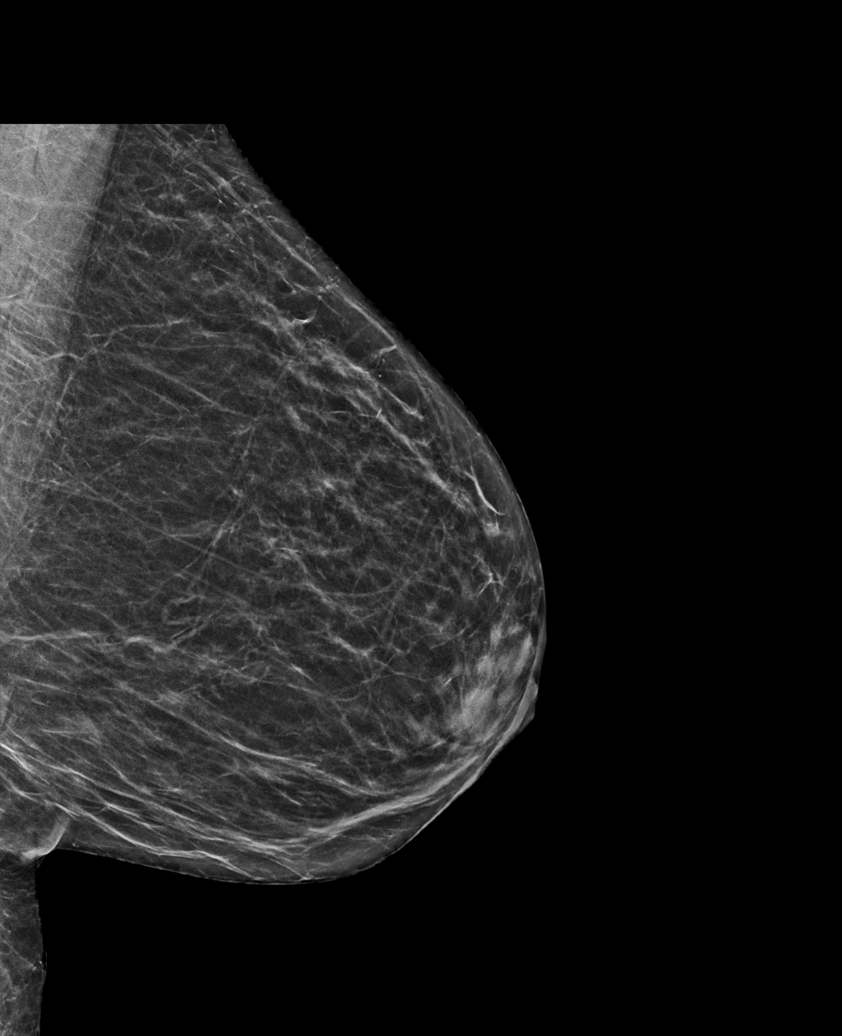

[L MLO synth-2D (2 of 2)]
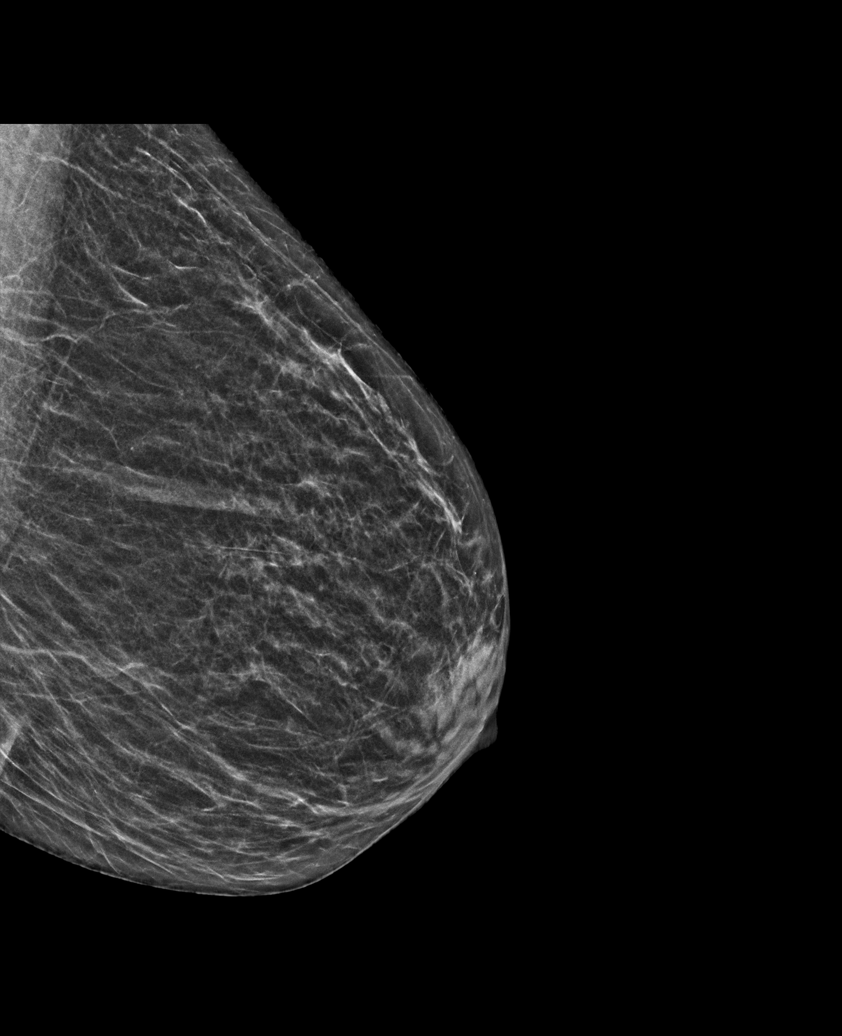

[R CC synth-2D]
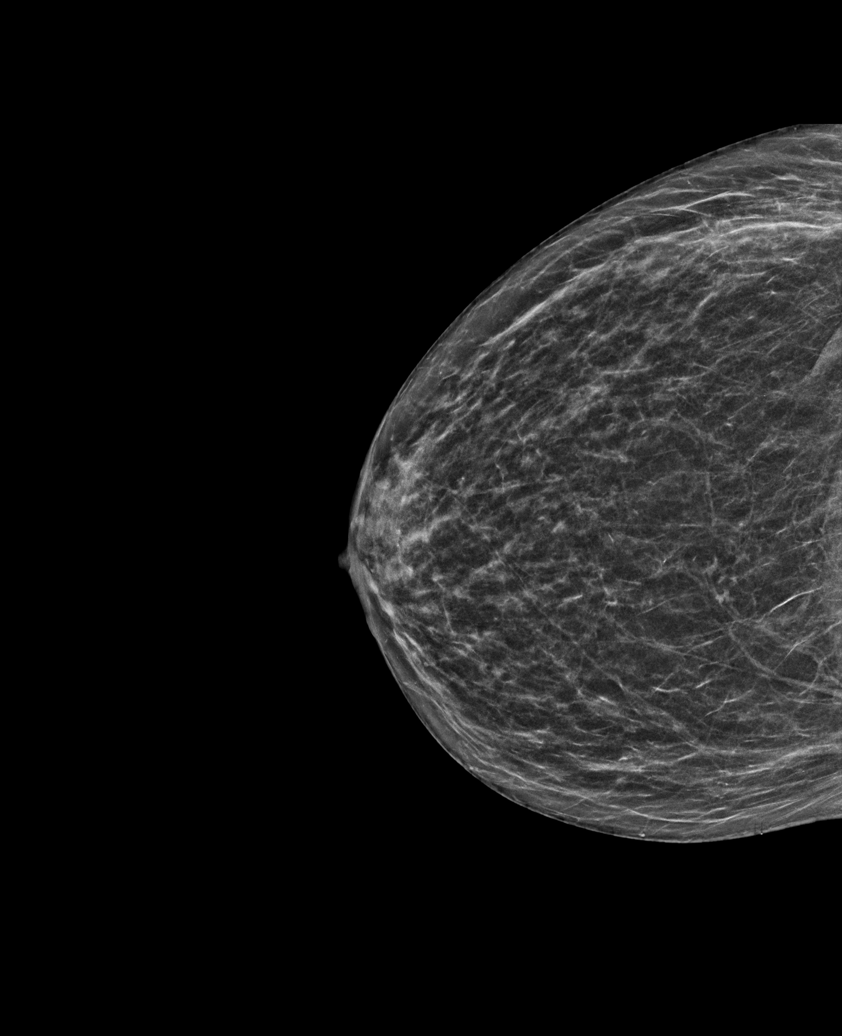

[R MLO synth-2D]
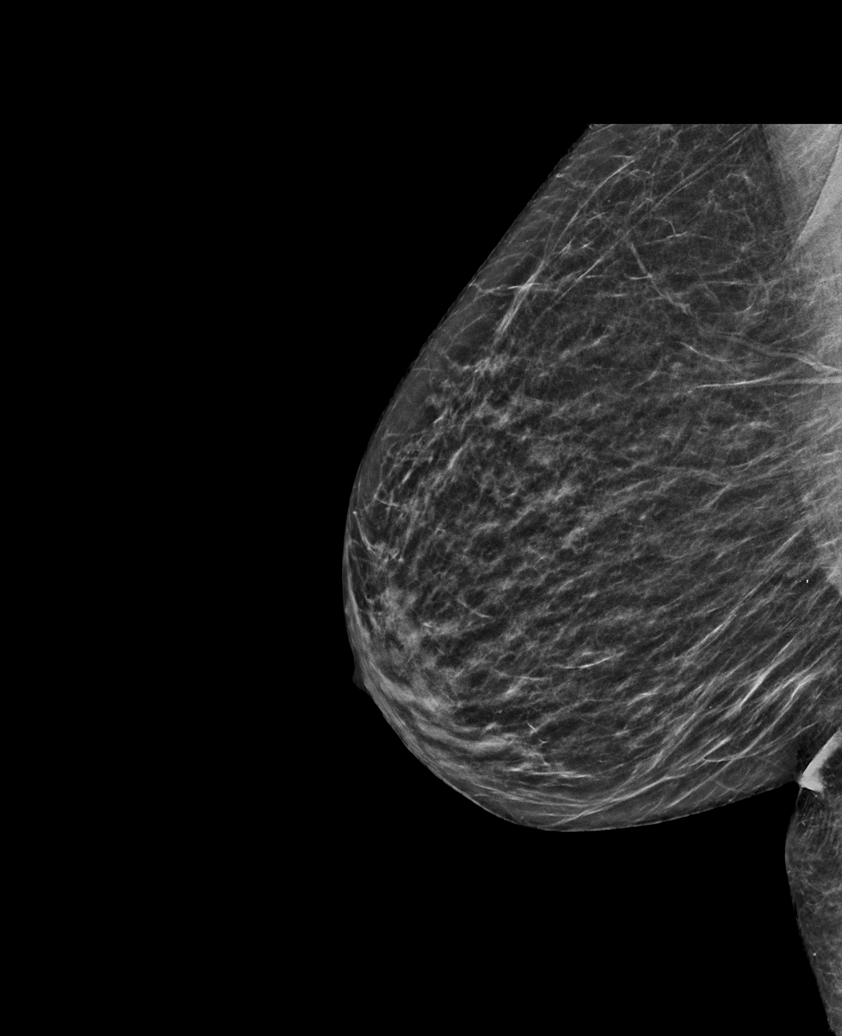

[L CC synth-2D]
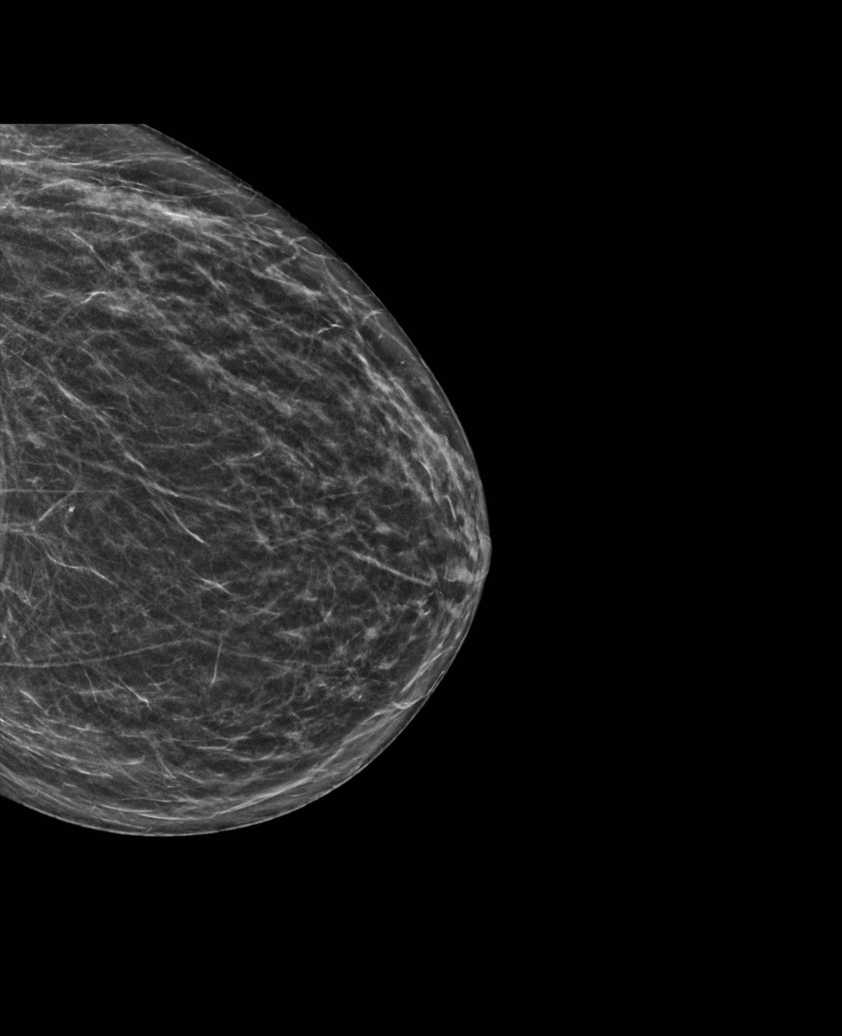

[L CC tomo · tomo slice 26/51.0]
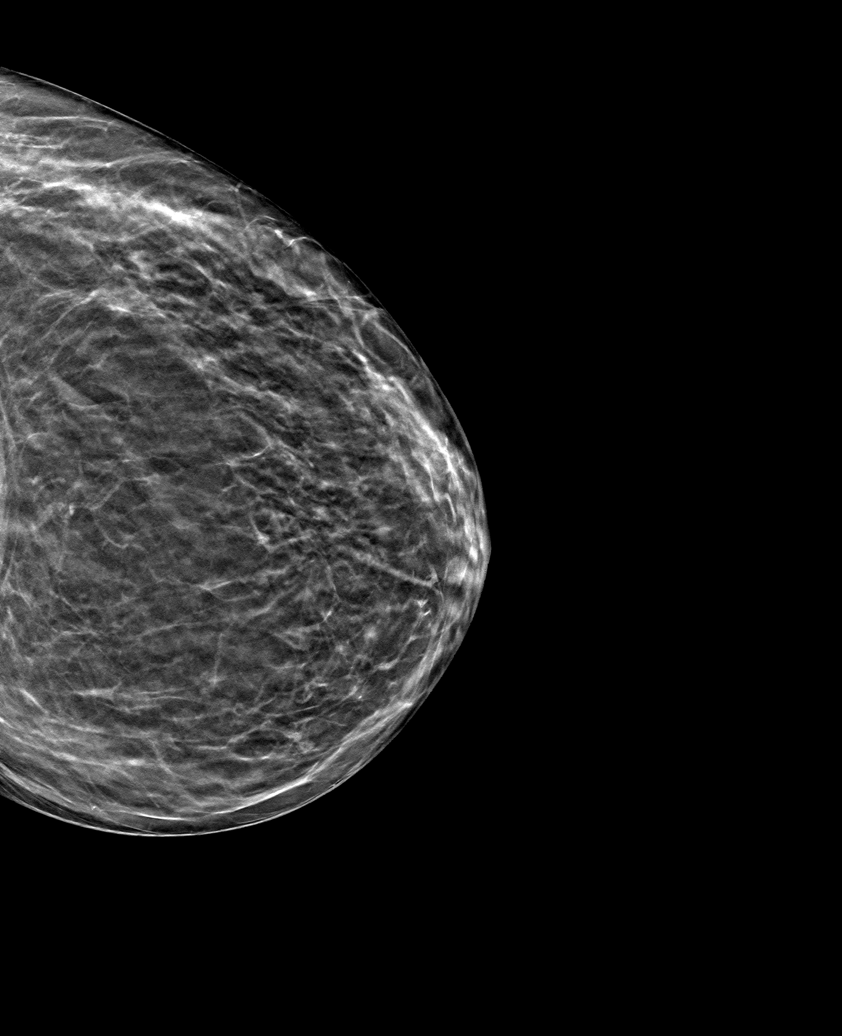

[6 of 30 positions shown; findings below may reference images not displayed]

ACR Breast Density Category b: There are scattered areas of
fibroglandular density.
FINDINGS: There are no findings suspicious for malignancy.
IMPRESSION: No mammographic evidence of malignancy. A result letter of this
screening mammogram will be mailed directly to the patient.

RECOMMENDATION:
Screening mammogram in one year. (Code:XG-X-X7B)

BI-RADS CATEGORY  1: Negative.
